# Patient Record
Sex: Female | Born: 1955 | Race: Black or African American | Hispanic: No | Marital: Single | Smoking: Current every day smoker
Health system: Southern US, Community
[De-identification: ages and names within clinical notes are randomized; demographics above are authoritative.]

## PROBLEM LIST (undated history)

## (undated) DIAGNOSIS — C4490 Unspecified malignant neoplasm of skin, unspecified: Secondary | ICD-10-CM

## (undated) DIAGNOSIS — I10 Essential (primary) hypertension: Secondary | ICD-10-CM

## (undated) DIAGNOSIS — Z90710 Acquired absence of both cervix and uterus: Secondary | ICD-10-CM

## (undated) DIAGNOSIS — E7800 Pure hypercholesterolemia, unspecified: Secondary | ICD-10-CM

## (undated) HISTORY — PX: LIVER SURGERY: SHX698

## (undated) HISTORY — PX: OTHER SURGICAL HISTORY: SHX169

## (undated) HISTORY — PX: BREAST BIOPSY: SHX20

## (undated) HISTORY — DX: Unspecified malignant neoplasm of skin, unspecified: C44.90

## (undated) HISTORY — DX: Pure hypercholesterolemia, unspecified: E78.00

## (undated) HISTORY — DX: Acquired absence of both cervix and uterus: Z90.710

## (undated) HISTORY — DX: Essential (primary) hypertension: I10

## (undated) HISTORY — PX: MOLE REMOVAL: SHX2046

---

## 2005-07-16 ENCOUNTER — Encounter: Admission: RE | Admit: 2005-07-16 | Discharge: 2005-07-16 | Payer: Self-pay

## 2006-10-06 ENCOUNTER — Emergency Department (HOSPITAL_COMMUNITY): Admission: EM | Admit: 2006-10-06 | Discharge: 2006-10-07 | Payer: Self-pay | Admitting: Emergency Medicine

## 2011-05-22 ENCOUNTER — Emergency Department (HOSPITAL_COMMUNITY)

## 2011-05-22 ENCOUNTER — Observation Stay (HOSPITAL_COMMUNITY)
Admission: EM | Admit: 2011-05-22 | Discharge: 2011-05-22 | Disposition: A | Discharge status: Home / Self Care | Attending: Emergency Medicine | Admitting: Emergency Medicine

## 2011-05-22 DIAGNOSIS — R079 Chest pain, unspecified: Principal | ICD-10-CM | POA: Insufficient documentation

## 2011-05-22 LAB — BASIC METABOLIC PANEL
Calcium: 9.3 mg/dL (ref 8.4–10.5)
GFR calc non Af Amer: 70 mL/min — ABNORMAL LOW (ref >90)
Potassium: 3.2 mEq/L — ABNORMAL LOW (ref 3.5–5.1)
Sodium: 138 mEq/L (ref 135–145)

## 2011-05-22 LAB — CBC
HCT: 36.5 % (ref 36.0–46.0)
Platelets: 242 10*3/uL (ref 150–400)
RBC: 4.68 MIL/uL (ref 3.87–5.11)
RDW: 15.2 % (ref 11.5–15.5)
WBC: 4.7 10*3/uL (ref 4.0–10.5)

## 2011-05-22 LAB — DIFFERENTIAL
Basophils Absolute: 0 10*3/uL (ref 0.0–0.1)
Eosinophils Absolute: 0.1 10*3/uL (ref 0.0–0.7)
Eosinophils Relative: 3 % (ref 0–5)
Lymphocytes Relative: 50 % — ABNORMAL HIGH (ref 12–46)
Lymphs Abs: 2.4 10*3/uL (ref 0.7–4.0)
Neutrophils Relative %: 41 % — ABNORMAL LOW (ref 43–77)

## 2011-05-22 LAB — POCT I-STAT TROPONIN I: Troponin i, poc: 0 ng/mL (ref 0.00–0.08)

## 2011-05-22 LAB — CK TOTAL AND CKMB (NOT AT ARMC): Total CK: 245 U/L — ABNORMAL HIGH (ref 7–177)

## 2011-06-20 ENCOUNTER — Encounter: Admitting: Cardiovascular Disease

## 2013-04-23 IMAGING — CT CT HEART SCORING
2 series · 16 of 20 positions shown, 18 images · non-contrast
Comparison: None.

CLINICAL DATA: Chest pain

CT HEART WITHOUT CALCIUM SCORING

[Series 3: calcium score · axial · 0.49mm/px · z∈[-180,-90]mm · 8 of 48 slices shown, 10 images]
[im 6/48  vessel]
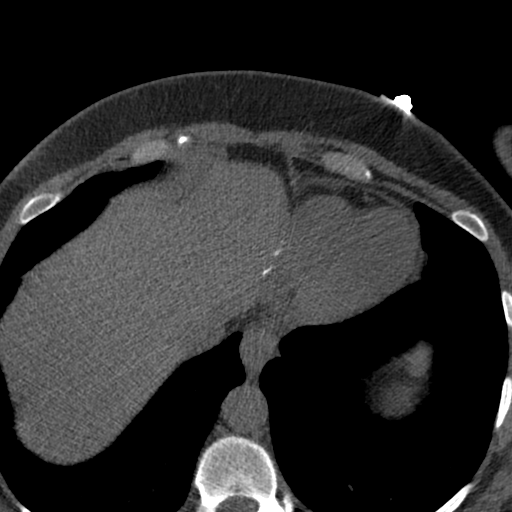
[im 6/48  lung]
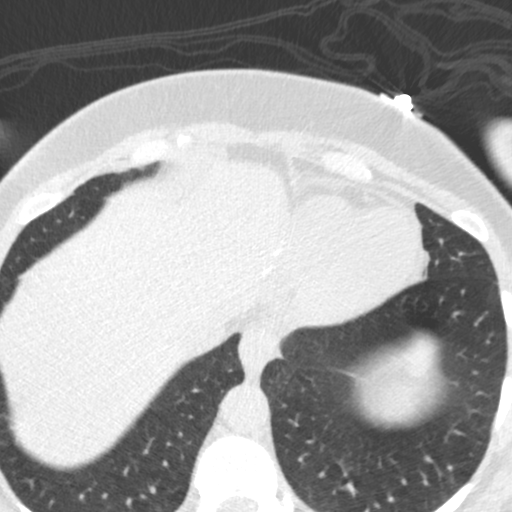
[im 11/48  vessel]
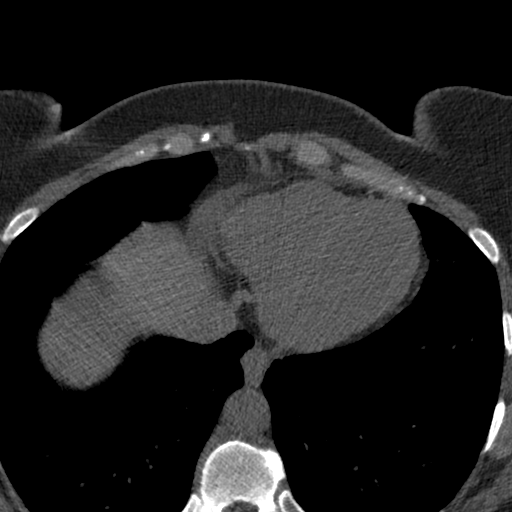
[im 16/48  vessel]
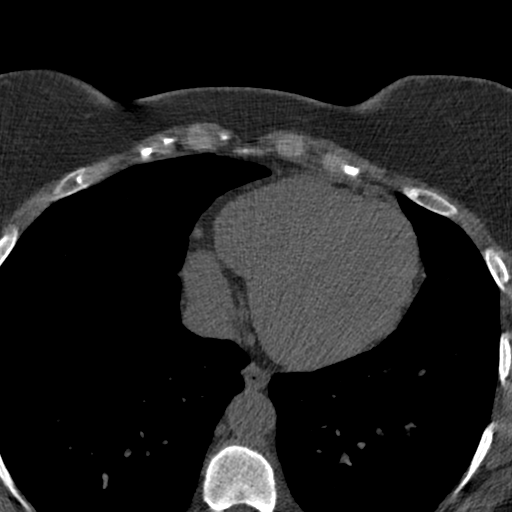
[im 21/48  vessel]
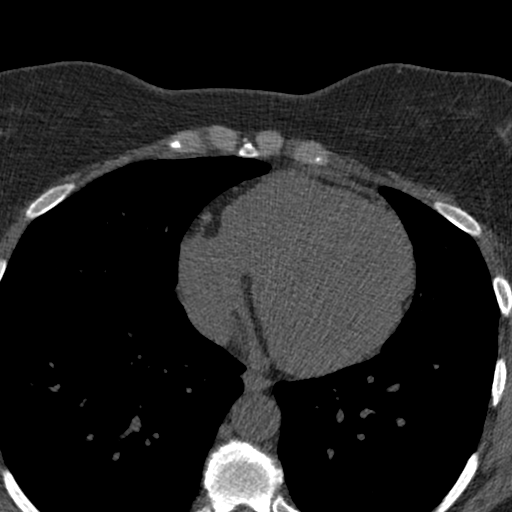
[im 27/48  vessel]
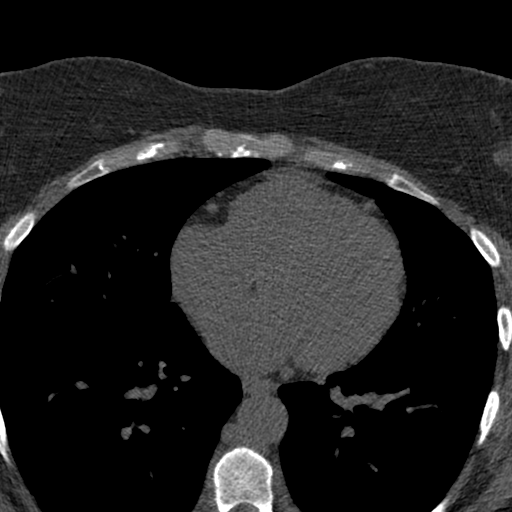
[im 27/48  lung]
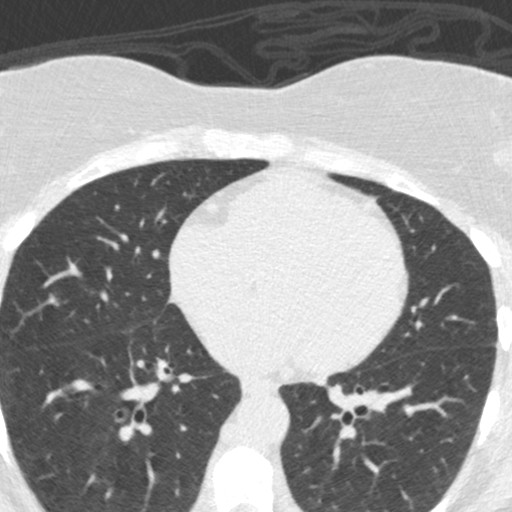
[im 32/48  vessel]
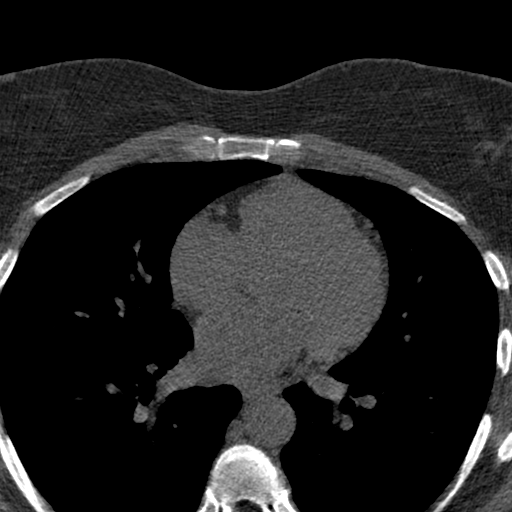
[im 37/48  vessel]
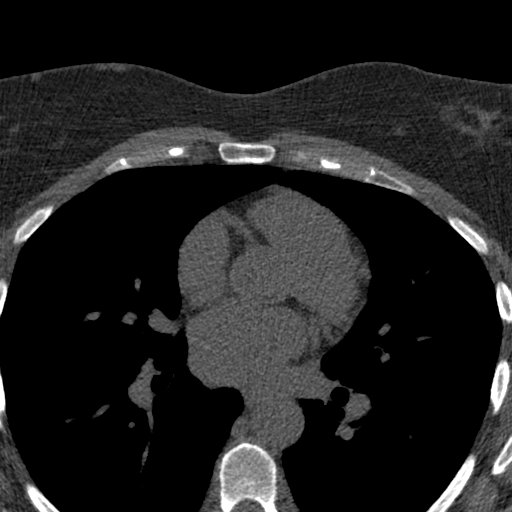
[im 42/48  vessel]
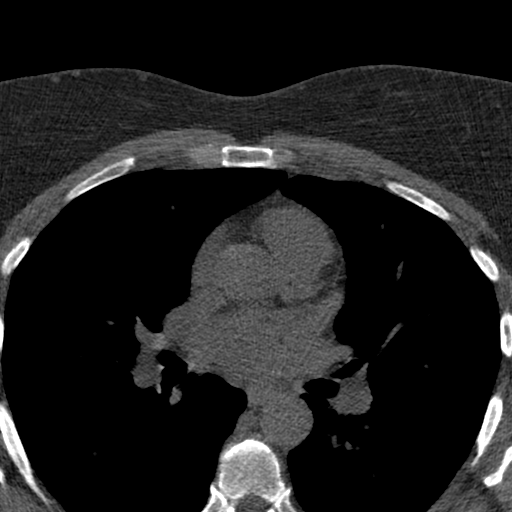

[Series 6: soft w/o · axial · non-contrast · 0.76mm/px · z∈[-180,-90]mm · 8 of 48 slices shown]
[im 6/48  vessel]
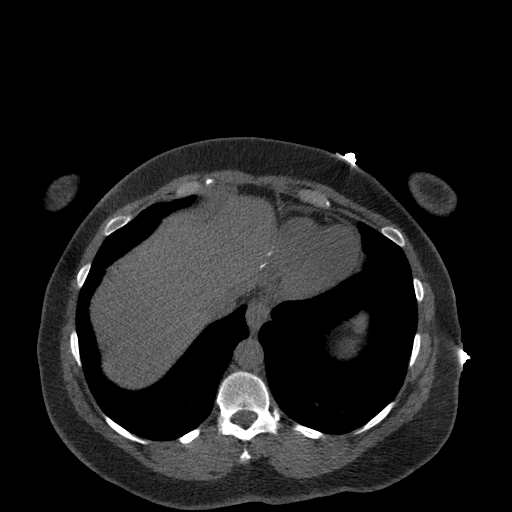
[im 11/48  vessel]
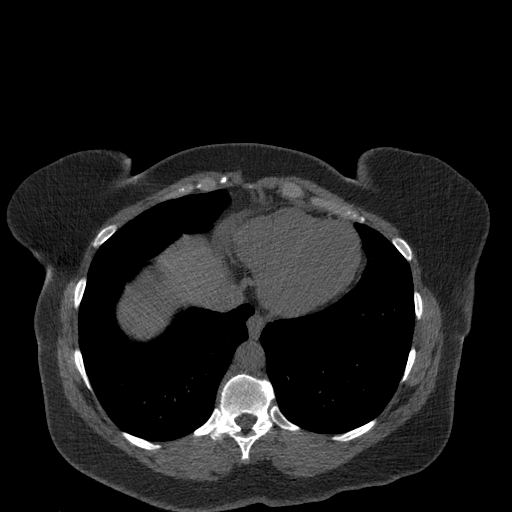
[im 16/48  vessel]
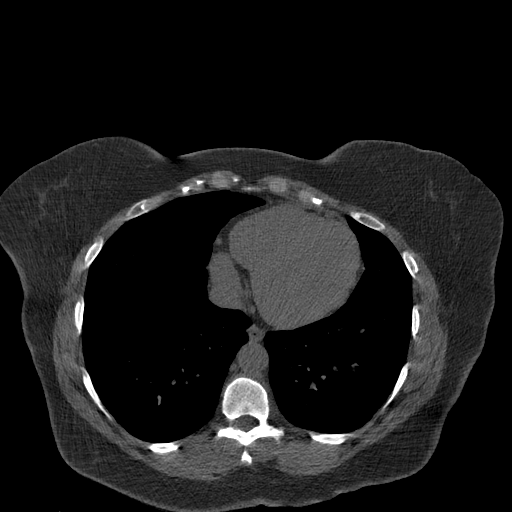
[im 21/48  vessel]
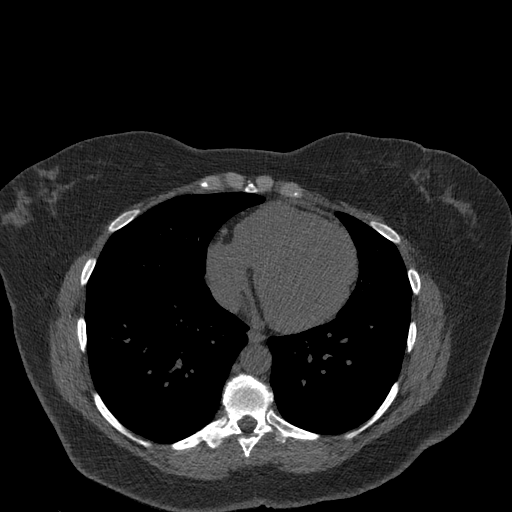
[im 27/48  vessel]
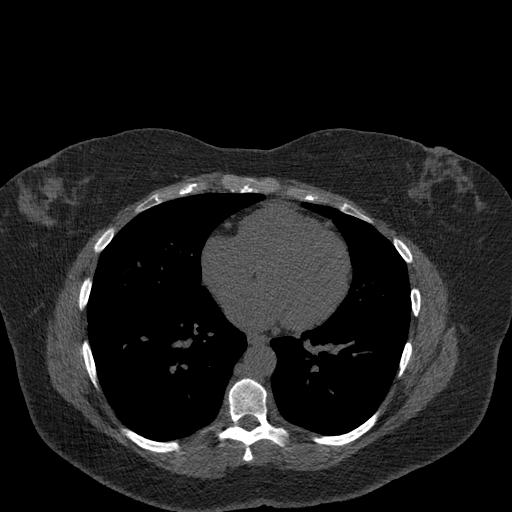
[im 32/48  vessel]
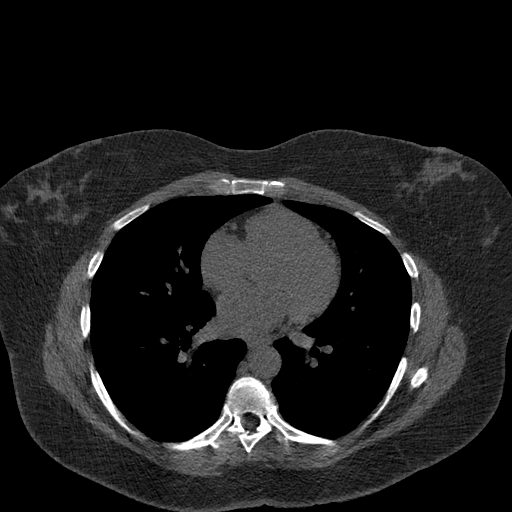
[im 37/48  vessel]
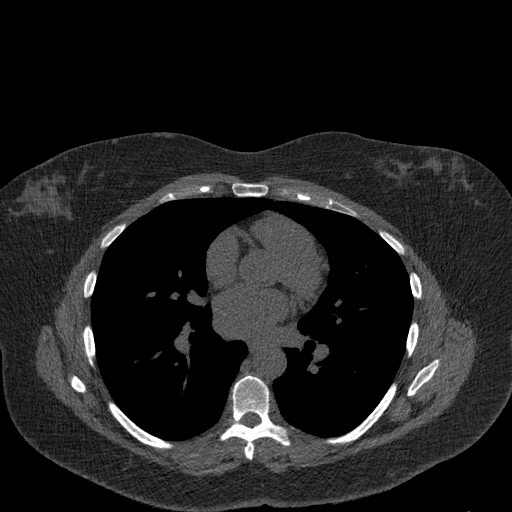
[im 42/48  vessel]
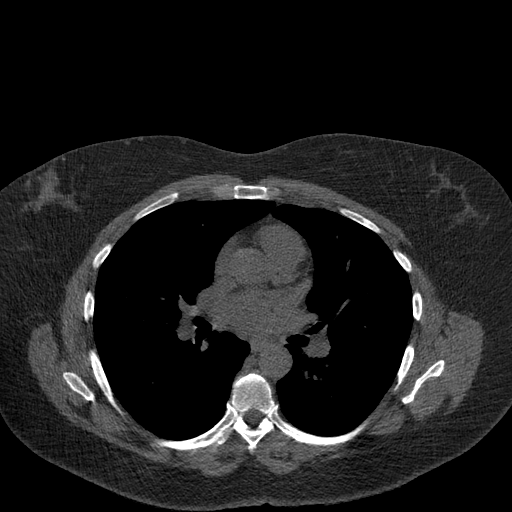

[16 of 20 positions shown; findings below may reference images not displayed]

FINDINGS: PREMEDICATION:
Lopressor 100 mg, P.O.
Lopressor 10 mg, IV
Nitroglycerin 0.4 mcg, sublingual.

Heart rate:  Variable, from the low 50s to low 80s.

The patient was initially scheduled for a cardiac CTA.  After the
calcium score and administration of the sublingual nitroglycerin,
the patient's heart rate became highly variable, ranging from the
low 50s to the low 80s.  The patient also was experiencing active
chest pain.  The heart rate could not be controlled well enough to
proceed with the CTA study.

CORONARY CALCIUM:
Total Agatston Score:  0
[HOSPITAL] percentile:  0%

EXTRACARDIAC FINDINGS:
Visualized lungs are clear.  No pleural effusions.  Heart is normal
size.  Small amount of calcium within the aortic valve.  No visible
mediastinal or hilar adenopathy

Postoperative changes are noted in the upper abdomen.  Surgical
clips/sutures noted along the left hepatic border.  Metallic clips
within the right lobe of the liver.  Visualized aorta is normal
caliber.  No acute bony abnormality.
IMPRESSION: CORONARY CALCIUM SCORE OF ZERO.  DUE TO A HIGHLY
VARIABLE HEART RATE AND ACTIVE CHEST PAIN, THE CTA PORTION COULD
NOT BE PERFORMED.

05/22/2011.

## 2019-11-22 DIAGNOSIS — E119 Type 2 diabetes mellitus without complications: Secondary | ICD-10-CM | POA: Diagnosis not present

## 2019-11-22 DIAGNOSIS — H04123 Dry eye syndrome of bilateral lacrimal glands: Secondary | ICD-10-CM | POA: Diagnosis not present

## 2019-11-22 DIAGNOSIS — Z6838 Body mass index (BMI) 38.0-38.9, adult: Secondary | ICD-10-CM | POA: Diagnosis not present

## 2019-11-22 DIAGNOSIS — I10 Essential (primary) hypertension: Secondary | ICD-10-CM | POA: Diagnosis not present

## 2019-11-22 DIAGNOSIS — E782 Mixed hyperlipidemia: Secondary | ICD-10-CM | POA: Diagnosis not present

## 2019-12-06 DIAGNOSIS — R69 Illness, unspecified: Secondary | ICD-10-CM | POA: Diagnosis not present

## 2019-12-15 DIAGNOSIS — M9901 Segmental and somatic dysfunction of cervical region: Secondary | ICD-10-CM | POA: Diagnosis not present

## 2019-12-15 DIAGNOSIS — M5033 Other cervical disc degeneration, cervicothoracic region: Secondary | ICD-10-CM | POA: Diagnosis not present

## 2019-12-16 DIAGNOSIS — M9901 Segmental and somatic dysfunction of cervical region: Secondary | ICD-10-CM | POA: Diagnosis not present

## 2019-12-16 DIAGNOSIS — M5033 Other cervical disc degeneration, cervicothoracic region: Secondary | ICD-10-CM | POA: Diagnosis not present

## 2019-12-22 DIAGNOSIS — M5033 Other cervical disc degeneration, cervicothoracic region: Secondary | ICD-10-CM | POA: Diagnosis not present

## 2019-12-22 DIAGNOSIS — M9901 Segmental and somatic dysfunction of cervical region: Secondary | ICD-10-CM | POA: Diagnosis not present

## 2019-12-23 DIAGNOSIS — M9901 Segmental and somatic dysfunction of cervical region: Secondary | ICD-10-CM | POA: Diagnosis not present

## 2019-12-23 DIAGNOSIS — M5033 Other cervical disc degeneration, cervicothoracic region: Secondary | ICD-10-CM | POA: Diagnosis not present

## 2019-12-27 DIAGNOSIS — M5033 Other cervical disc degeneration, cervicothoracic region: Secondary | ICD-10-CM | POA: Diagnosis not present

## 2019-12-27 DIAGNOSIS — M9901 Segmental and somatic dysfunction of cervical region: Secondary | ICD-10-CM | POA: Diagnosis not present

## 2020-04-20 DIAGNOSIS — R69 Illness, unspecified: Secondary | ICD-10-CM | POA: Diagnosis not present

## 2020-04-25 DIAGNOSIS — R69 Illness, unspecified: Secondary | ICD-10-CM | POA: Diagnosis not present

## 2020-05-05 DIAGNOSIS — E669 Obesity, unspecified: Secondary | ICD-10-CM | POA: Diagnosis not present

## 2020-05-05 DIAGNOSIS — H547 Unspecified visual loss: Secondary | ICD-10-CM | POA: Diagnosis not present

## 2020-05-05 DIAGNOSIS — R600 Localized edema: Secondary | ICD-10-CM | POA: Diagnosis not present

## 2020-05-05 DIAGNOSIS — I10 Essential (primary) hypertension: Secondary | ICD-10-CM | POA: Diagnosis not present

## 2020-05-05 DIAGNOSIS — Z87891 Personal history of nicotine dependence: Secondary | ICD-10-CM | POA: Diagnosis not present

## 2020-05-16 DIAGNOSIS — R69 Illness, unspecified: Secondary | ICD-10-CM | POA: Diagnosis not present

## 2020-12-25 DIAGNOSIS — I10 Essential (primary) hypertension: Secondary | ICD-10-CM | POA: Diagnosis not present

## 2020-12-25 DIAGNOSIS — E119 Type 2 diabetes mellitus without complications: Secondary | ICD-10-CM | POA: Diagnosis not present

## 2020-12-25 DIAGNOSIS — Z7984 Long term (current) use of oral hypoglycemic drugs: Secondary | ICD-10-CM | POA: Diagnosis not present

## 2020-12-25 DIAGNOSIS — Z87891 Personal history of nicotine dependence: Secondary | ICD-10-CM | POA: Diagnosis not present

## 2020-12-25 DIAGNOSIS — E785 Hyperlipidemia, unspecified: Secondary | ICD-10-CM | POA: Diagnosis not present

## 2020-12-25 DIAGNOSIS — R609 Edema, unspecified: Secondary | ICD-10-CM | POA: Diagnosis not present

## 2020-12-25 DIAGNOSIS — Z6836 Body mass index (BMI) 36.0-36.9, adult: Secondary | ICD-10-CM | POA: Diagnosis not present

## 2021-06-12 ENCOUNTER — Encounter: Payer: Self-pay | Admitting: Internal Medicine

## 2021-07-09 DIAGNOSIS — T189XXA Foreign body of alimentary tract, part unspecified, initial encounter: Secondary | ICD-10-CM | POA: Diagnosis not present

## 2021-10-03 DIAGNOSIS — M25562 Pain in left knee: Secondary | ICD-10-CM | POA: Diagnosis not present

## 2021-10-03 DIAGNOSIS — Z6837 Body mass index (BMI) 37.0-37.9, adult: Secondary | ICD-10-CM | POA: Diagnosis not present

## 2021-10-03 DIAGNOSIS — M7062 Trochanteric bursitis, left hip: Secondary | ICD-10-CM | POA: Diagnosis not present

## 2021-10-03 DIAGNOSIS — S39012A Strain of muscle, fascia and tendon of lower back, initial encounter: Secondary | ICD-10-CM | POA: Diagnosis not present

## 2021-10-03 DIAGNOSIS — M1712 Unilateral primary osteoarthritis, left knee: Secondary | ICD-10-CM | POA: Diagnosis not present

## 2021-10-19 ENCOUNTER — Ambulatory Visit: Admitting: Gastroenterology

## 2021-11-15 DIAGNOSIS — M79605 Pain in left leg: Secondary | ICD-10-CM | POA: Diagnosis not present

## 2021-11-15 DIAGNOSIS — Z6836 Body mass index (BMI) 36.0-36.9, adult: Secondary | ICD-10-CM | POA: Diagnosis not present

## 2021-11-15 DIAGNOSIS — M5416 Radiculopathy, lumbar region: Secondary | ICD-10-CM | POA: Diagnosis not present

## 2021-11-15 DIAGNOSIS — S39012A Strain of muscle, fascia and tendon of lower back, initial encounter: Secondary | ICD-10-CM | POA: Diagnosis not present

## 2021-11-21 DIAGNOSIS — M79605 Pain in left leg: Secondary | ICD-10-CM | POA: Diagnosis not present

## 2021-11-21 DIAGNOSIS — S39012A Strain of muscle, fascia and tendon of lower back, initial encounter: Secondary | ICD-10-CM | POA: Diagnosis not present

## 2021-11-21 DIAGNOSIS — M5416 Radiculopathy, lumbar region: Secondary | ICD-10-CM | POA: Diagnosis not present

## 2021-11-23 DIAGNOSIS — S39012A Strain of muscle, fascia and tendon of lower back, initial encounter: Secondary | ICD-10-CM | POA: Diagnosis not present

## 2021-11-23 DIAGNOSIS — M79605 Pain in left leg: Secondary | ICD-10-CM | POA: Diagnosis not present

## 2021-11-23 DIAGNOSIS — M5416 Radiculopathy, lumbar region: Secondary | ICD-10-CM | POA: Diagnosis not present

## 2021-11-26 DIAGNOSIS — S39012A Strain of muscle, fascia and tendon of lower back, initial encounter: Secondary | ICD-10-CM | POA: Diagnosis not present

## 2021-11-26 DIAGNOSIS — M79605 Pain in left leg: Secondary | ICD-10-CM | POA: Diagnosis not present

## 2021-11-26 DIAGNOSIS — M5416 Radiculopathy, lumbar region: Secondary | ICD-10-CM | POA: Diagnosis not present

## 2021-11-27 DIAGNOSIS — Z6835 Body mass index (BMI) 35.0-35.9, adult: Secondary | ICD-10-CM | POA: Diagnosis not present

## 2021-11-27 DIAGNOSIS — M5416 Radiculopathy, lumbar region: Secondary | ICD-10-CM | POA: Diagnosis not present

## 2021-11-27 DIAGNOSIS — M79605 Pain in left leg: Secondary | ICD-10-CM | POA: Diagnosis not present

## 2021-11-30 DIAGNOSIS — X58XXXA Exposure to other specified factors, initial encounter: Secondary | ICD-10-CM | POA: Diagnosis not present

## 2021-11-30 DIAGNOSIS — S39012A Strain of muscle, fascia and tendon of lower back, initial encounter: Secondary | ICD-10-CM | POA: Diagnosis not present

## 2021-11-30 DIAGNOSIS — M79605 Pain in left leg: Secondary | ICD-10-CM | POA: Diagnosis not present

## 2021-11-30 DIAGNOSIS — M47816 Spondylosis without myelopathy or radiculopathy, lumbar region: Secondary | ICD-10-CM | POA: Diagnosis not present

## 2021-11-30 DIAGNOSIS — M5126 Other intervertebral disc displacement, lumbar region: Secondary | ICD-10-CM | POA: Diagnosis not present

## 2021-11-30 DIAGNOSIS — M545 Low back pain, unspecified: Secondary | ICD-10-CM | POA: Diagnosis not present

## 2021-12-01 DIAGNOSIS — M899 Disorder of bone, unspecified: Secondary | ICD-10-CM | POA: Diagnosis not present

## 2021-12-01 DIAGNOSIS — E782 Mixed hyperlipidemia: Secondary | ICD-10-CM | POA: Diagnosis not present

## 2021-12-01 DIAGNOSIS — R937 Abnormal findings on diagnostic imaging of other parts of musculoskeletal system: Secondary | ICD-10-CM | POA: Diagnosis not present

## 2021-12-03 DIAGNOSIS — M79605 Pain in left leg: Secondary | ICD-10-CM | POA: Diagnosis not present

## 2021-12-03 DIAGNOSIS — S39012A Strain of muscle, fascia and tendon of lower back, initial encounter: Secondary | ICD-10-CM | POA: Diagnosis not present

## 2021-12-03 DIAGNOSIS — M5416 Radiculopathy, lumbar region: Secondary | ICD-10-CM | POA: Diagnosis not present

## 2021-12-05 DIAGNOSIS — R52 Pain, unspecified: Secondary | ICD-10-CM | POA: Diagnosis not present

## 2021-12-05 DIAGNOSIS — R918 Other nonspecific abnormal finding of lung field: Secondary | ICD-10-CM | POA: Diagnosis not present

## 2021-12-05 DIAGNOSIS — M545 Low back pain, unspecified: Secondary | ICD-10-CM | POA: Diagnosis not present

## 2021-12-05 DIAGNOSIS — M549 Dorsalgia, unspecified: Secondary | ICD-10-CM | POA: Diagnosis not present

## 2021-12-05 DIAGNOSIS — M5410 Radiculopathy, site unspecified: Secondary | ICD-10-CM | POA: Diagnosis not present

## 2021-12-05 DIAGNOSIS — E669 Obesity, unspecified: Secondary | ICD-10-CM | POA: Diagnosis not present

## 2021-12-05 DIAGNOSIS — R079 Chest pain, unspecified: Secondary | ICD-10-CM | POA: Diagnosis not present

## 2021-12-05 DIAGNOSIS — C9001 Multiple myeloma in remission: Secondary | ICD-10-CM | POA: Diagnosis not present

## 2021-12-05 DIAGNOSIS — Z20822 Contact with and (suspected) exposure to covid-19: Secondary | ICD-10-CM | POA: Diagnosis not present

## 2021-12-05 DIAGNOSIS — Z87891 Personal history of nicotine dependence: Secondary | ICD-10-CM | POA: Diagnosis not present

## 2021-12-05 DIAGNOSIS — G893 Neoplasm related pain (acute) (chronic): Secondary | ICD-10-CM | POA: Diagnosis not present

## 2021-12-05 DIAGNOSIS — C7951 Secondary malignant neoplasm of bone: Secondary | ICD-10-CM | POA: Diagnosis not present

## 2021-12-05 DIAGNOSIS — E559 Vitamin D deficiency, unspecified: Secondary | ICD-10-CM | POA: Diagnosis not present

## 2021-12-05 DIAGNOSIS — C7949 Secondary malignant neoplasm of other parts of nervous system: Secondary | ICD-10-CM | POA: Diagnosis not present

## 2021-12-05 DIAGNOSIS — E119 Type 2 diabetes mellitus without complications: Secondary | ICD-10-CM | POA: Diagnosis not present

## 2021-12-05 DIAGNOSIS — C801 Malignant (primary) neoplasm, unspecified: Secondary | ICD-10-CM | POA: Diagnosis not present

## 2021-12-05 DIAGNOSIS — I10 Essential (primary) hypertension: Secondary | ICD-10-CM | POA: Diagnosis not present

## 2021-12-06 DIAGNOSIS — M2578 Osteophyte, vertebrae: Secondary | ICD-10-CM | POA: Diagnosis not present

## 2021-12-06 DIAGNOSIS — C3490 Malignant neoplasm of unspecified part of unspecified bronchus or lung: Secondary | ICD-10-CM | POA: Diagnosis not present

## 2021-12-06 DIAGNOSIS — H5789 Other specified disorders of eye and adnexa: Secondary | ICD-10-CM | POA: Diagnosis not present

## 2021-12-06 DIAGNOSIS — M48062 Spinal stenosis, lumbar region with neurogenic claudication: Secondary | ICD-10-CM | POA: Diagnosis not present

## 2021-12-06 DIAGNOSIS — R0789 Other chest pain: Secondary | ICD-10-CM | POA: Diagnosis not present

## 2021-12-06 DIAGNOSIS — M62830 Muscle spasm of back: Secondary | ICD-10-CM | POA: Diagnosis not present

## 2021-12-06 DIAGNOSIS — E669 Obesity, unspecified: Secondary | ICD-10-CM | POA: Diagnosis not present

## 2021-12-06 DIAGNOSIS — C720 Malignant neoplasm of spinal cord: Secondary | ICD-10-CM | POA: Diagnosis not present

## 2021-12-06 DIAGNOSIS — C7951 Secondary malignant neoplasm of bone: Secondary | ICD-10-CM | POA: Diagnosis not present

## 2021-12-06 DIAGNOSIS — C9001 Multiple myeloma in remission: Secondary | ICD-10-CM | POA: Diagnosis not present

## 2021-12-06 DIAGNOSIS — M50221 Other cervical disc displacement at C4-C5 level: Secondary | ICD-10-CM | POA: Diagnosis not present

## 2021-12-06 DIAGNOSIS — K769 Liver disease, unspecified: Secondary | ICD-10-CM | POA: Diagnosis not present

## 2021-12-06 DIAGNOSIS — R918 Other nonspecific abnormal finding of lung field: Secondary | ICD-10-CM | POA: Diagnosis not present

## 2021-12-06 DIAGNOSIS — M50222 Other cervical disc displacement at C5-C6 level: Secondary | ICD-10-CM | POA: Diagnosis not present

## 2021-12-06 DIAGNOSIS — Y92230 Patient room in hospital as the place of occurrence of the external cause: Secondary | ICD-10-CM | POA: Diagnosis not present

## 2021-12-06 DIAGNOSIS — D6481 Anemia due to antineoplastic chemotherapy: Secondary | ICD-10-CM | POA: Diagnosis not present

## 2021-12-06 DIAGNOSIS — C8330 Diffuse large B-cell lymphoma, unspecified site: Secondary | ICD-10-CM | POA: Diagnosis not present

## 2021-12-06 DIAGNOSIS — R112 Nausea with vomiting, unspecified: Secondary | ICD-10-CM | POA: Diagnosis not present

## 2021-12-06 DIAGNOSIS — K5730 Diverticulosis of large intestine without perforation or abscess without bleeding: Secondary | ICD-10-CM | POA: Diagnosis not present

## 2021-12-06 DIAGNOSIS — T402X5A Adverse effect of other opioids, initial encounter: Secondary | ICD-10-CM | POA: Diagnosis not present

## 2021-12-06 DIAGNOSIS — R110 Nausea: Secondary | ICD-10-CM | POA: Diagnosis not present

## 2021-12-06 DIAGNOSIS — K5903 Drug induced constipation: Secondary | ICD-10-CM | POA: Diagnosis not present

## 2021-12-06 DIAGNOSIS — M549 Dorsalgia, unspecified: Secondary | ICD-10-CM | POA: Diagnosis not present

## 2021-12-06 DIAGNOSIS — R52 Pain, unspecified: Secondary | ICD-10-CM | POA: Diagnosis not present

## 2021-12-06 DIAGNOSIS — M792 Neuralgia and neuritis, unspecified: Secondary | ICD-10-CM | POA: Diagnosis not present

## 2021-12-06 DIAGNOSIS — I700 Atherosclerosis of aorta: Secondary | ICD-10-CM | POA: Diagnosis not present

## 2021-12-06 DIAGNOSIS — R590 Localized enlarged lymph nodes: Secondary | ICD-10-CM | POA: Diagnosis not present

## 2021-12-06 DIAGNOSIS — Z20822 Contact with and (suspected) exposure to covid-19: Secondary | ICD-10-CM | POA: Diagnosis not present

## 2021-12-06 DIAGNOSIS — K5900 Constipation, unspecified: Secondary | ICD-10-CM | POA: Diagnosis not present

## 2021-12-06 DIAGNOSIS — T451X5A Adverse effect of antineoplastic and immunosuppressive drugs, initial encounter: Secondary | ICD-10-CM | POA: Diagnosis not present

## 2021-12-06 DIAGNOSIS — C779 Secondary and unspecified malignant neoplasm of lymph node, unspecified: Secondary | ICD-10-CM | POA: Diagnosis not present

## 2021-12-06 DIAGNOSIS — I10 Essential (primary) hypertension: Secondary | ICD-10-CM | POA: Diagnosis not present

## 2021-12-06 DIAGNOSIS — K449 Diaphragmatic hernia without obstruction or gangrene: Secondary | ICD-10-CM | POA: Diagnosis not present

## 2021-12-06 DIAGNOSIS — M899 Disorder of bone, unspecified: Secondary | ICD-10-CM | POA: Diagnosis not present

## 2021-12-06 DIAGNOSIS — D72820 Lymphocytosis (symptomatic): Secondary | ICD-10-CM | POA: Diagnosis not present

## 2021-12-06 DIAGNOSIS — D649 Anemia, unspecified: Secondary | ICD-10-CM | POA: Diagnosis not present

## 2021-12-06 DIAGNOSIS — M84859 Other disorders of continuity of bone, unspecified pelvic region and thigh: Secondary | ICD-10-CM | POA: Diagnosis not present

## 2021-12-06 DIAGNOSIS — Z87891 Personal history of nicotine dependence: Secondary | ICD-10-CM | POA: Diagnosis not present

## 2021-12-06 DIAGNOSIS — G893 Neoplasm related pain (acute) (chronic): Secondary | ICD-10-CM | POA: Diagnosis not present

## 2021-12-06 DIAGNOSIS — I6781 Acute cerebrovascular insufficiency: Secondary | ICD-10-CM | POA: Diagnosis not present

## 2021-12-06 DIAGNOSIS — Z510 Encounter for antineoplastic radiation therapy: Secondary | ICD-10-CM | POA: Diagnosis not present

## 2021-12-06 DIAGNOSIS — D72829 Elevated white blood cell count, unspecified: Secondary | ICD-10-CM | POA: Diagnosis not present

## 2021-12-06 DIAGNOSIS — M8458XA Pathological fracture in neoplastic disease, other specified site, initial encounter for fracture: Secondary | ICD-10-CM | POA: Diagnosis not present

## 2021-12-06 DIAGNOSIS — Z6836 Body mass index (BMI) 36.0-36.9, adult: Secondary | ICD-10-CM | POA: Diagnosis not present

## 2021-12-06 DIAGNOSIS — E119 Type 2 diabetes mellitus without complications: Secondary | ICD-10-CM | POA: Diagnosis not present

## 2021-12-06 DIAGNOSIS — C787 Secondary malignant neoplasm of liver and intrahepatic bile duct: Secondary | ICD-10-CM | POA: Diagnosis not present

## 2021-12-06 DIAGNOSIS — M48061 Spinal stenosis, lumbar region without neurogenic claudication: Secondary | ICD-10-CM | POA: Diagnosis not present

## 2021-12-06 DIAGNOSIS — C801 Malignant (primary) neoplasm, unspecified: Secondary | ICD-10-CM | POA: Diagnosis not present

## 2021-12-06 DIAGNOSIS — R339 Retention of urine, unspecified: Secondary | ICD-10-CM | POA: Diagnosis not present

## 2021-12-06 DIAGNOSIS — C8300 Small cell B-cell lymphoma, unspecified site: Secondary | ICD-10-CM | POA: Diagnosis not present

## 2021-12-06 DIAGNOSIS — C3431 Malignant neoplasm of lower lobe, right bronchus or lung: Secondary | ICD-10-CM | POA: Diagnosis not present

## 2021-12-10 DIAGNOSIS — C7951 Secondary malignant neoplasm of bone: Secondary | ICD-10-CM | POA: Diagnosis not present

## 2021-12-10 DIAGNOSIS — C720 Malignant neoplasm of spinal cord: Secondary | ICD-10-CM | POA: Diagnosis not present

## 2021-12-20 DIAGNOSIS — C720 Malignant neoplasm of spinal cord: Secondary | ICD-10-CM | POA: Diagnosis not present

## 2021-12-20 DIAGNOSIS — Z510 Encounter for antineoplastic radiation therapy: Secondary | ICD-10-CM | POA: Diagnosis not present

## 2021-12-20 DIAGNOSIS — C7951 Secondary malignant neoplasm of bone: Secondary | ICD-10-CM | POA: Diagnosis not present

## 2021-12-24 DIAGNOSIS — C7951 Secondary malignant neoplasm of bone: Secondary | ICD-10-CM | POA: Diagnosis not present

## 2021-12-24 DIAGNOSIS — C801 Malignant (primary) neoplasm, unspecified: Secondary | ICD-10-CM | POA: Diagnosis not present

## 2021-12-24 DIAGNOSIS — C787 Secondary malignant neoplasm of liver and intrahepatic bile duct: Secondary | ICD-10-CM | POA: Diagnosis not present

## 2021-12-25 DIAGNOSIS — C7951 Secondary malignant neoplasm of bone: Secondary | ICD-10-CM | POA: Diagnosis not present

## 2021-12-26 DIAGNOSIS — G893 Neoplasm related pain (acute) (chronic): Secondary | ICD-10-CM | POA: Diagnosis not present

## 2021-12-26 DIAGNOSIS — C801 Malignant (primary) neoplasm, unspecified: Secondary | ICD-10-CM | POA: Diagnosis not present

## 2021-12-26 DIAGNOSIS — M792 Neuralgia and neuritis, unspecified: Secondary | ICD-10-CM | POA: Diagnosis not present

## 2021-12-26 DIAGNOSIS — C7951 Secondary malignant neoplasm of bone: Secondary | ICD-10-CM | POA: Diagnosis not present

## 2021-12-26 DIAGNOSIS — C787 Secondary malignant neoplasm of liver and intrahepatic bile duct: Secondary | ICD-10-CM | POA: Diagnosis not present

## 2021-12-26 DIAGNOSIS — M5442 Lumbago with sciatica, left side: Secondary | ICD-10-CM | POA: Diagnosis not present

## 2021-12-27 DIAGNOSIS — D84821 Immunodeficiency due to drugs: Secondary | ICD-10-CM | POA: Diagnosis not present

## 2021-12-27 DIAGNOSIS — C787 Secondary malignant neoplasm of liver and intrahepatic bile duct: Secondary | ICD-10-CM | POA: Diagnosis not present

## 2021-12-27 DIAGNOSIS — D649 Anemia, unspecified: Secondary | ICD-10-CM | POA: Diagnosis not present

## 2021-12-27 DIAGNOSIS — Z5181 Encounter for therapeutic drug level monitoring: Secondary | ICD-10-CM | POA: Diagnosis not present

## 2021-12-27 DIAGNOSIS — C771 Secondary and unspecified malignant neoplasm of intrathoracic lymph nodes: Secondary | ICD-10-CM | POA: Diagnosis not present

## 2021-12-27 DIAGNOSIS — C7951 Secondary malignant neoplasm of bone: Secondary | ICD-10-CM | POA: Diagnosis not present

## 2021-12-27 DIAGNOSIS — G893 Neoplasm related pain (acute) (chronic): Secondary | ICD-10-CM | POA: Diagnosis not present

## 2021-12-27 DIAGNOSIS — C3491 Malignant neoplasm of unspecified part of right bronchus or lung: Secondary | ICD-10-CM | POA: Diagnosis not present

## 2021-12-27 DIAGNOSIS — C9110 Chronic lymphocytic leukemia of B-cell type not having achieved remission: Secondary | ICD-10-CM | POA: Diagnosis not present

## 2021-12-27 DIAGNOSIS — Z79899 Other long term (current) drug therapy: Secondary | ICD-10-CM | POA: Diagnosis not present

## 2021-12-30 DIAGNOSIS — R4020 Unspecified coma: Secondary | ICD-10-CM | POA: Diagnosis not present

## 2021-12-30 DIAGNOSIS — I499 Cardiac arrhythmia, unspecified: Secondary | ICD-10-CM | POA: Diagnosis not present

## 2021-12-30 DIAGNOSIS — R0689 Other abnormalities of breathing: Secondary | ICD-10-CM | POA: Diagnosis not present

## 2021-12-30 DIAGNOSIS — R404 Transient alteration of awareness: Secondary | ICD-10-CM | POA: Diagnosis not present

## 2021-12-30 DIAGNOSIS — R000 Tachycardia, unspecified: Secondary | ICD-10-CM | POA: Diagnosis not present

## 2022-01-19 DEATH — deceased

## 2022-03-06 ENCOUNTER — Other Ambulatory Visit: Payer: Self-pay | Admitting: *Deleted

## 2022-03-06 NOTE — Patient Outreach (Signed)
  Care Coordination   03/06/2022 Name: Tiffany Baker MRN: [id number] DOB: Mar 18, 1956   Care Coordination Outreach Attempts:  An unsuccessful telephone outreach was attempted today to offer the patient information about available care coordination services as a benefit of their health plan.   Follow Up Plan:  No further outreach attempts will be made at this time. We have been unable to contact the patient to offer or enroll patient in care coordination services  Listed number is invalid.  Encounter Outcome:  No Answer  Care Coordination Interventions Activated:  No   Care Coordination Interventions:  No, not indicated    Valente David, RN, MSN, [facility] Care Coordinator [phone]
